# Patient Record
Sex: Female | Born: 1945 | Race: Black or African American | Hispanic: No | Smoking: Former smoker
Health system: Southern US, Community
[De-identification: ages and names within clinical notes are randomized; demographics above are authoritative.]

## PROBLEM LIST (undated history)

## (undated) DIAGNOSIS — K219 Gastro-esophageal reflux disease without esophagitis: Secondary | ICD-10-CM

## (undated) DIAGNOSIS — F32A Depression, unspecified: Secondary | ICD-10-CM

## (undated) DIAGNOSIS — I10 Essential (primary) hypertension: Secondary | ICD-10-CM

## (undated) DIAGNOSIS — K5790 Diverticulosis of intestine, part unspecified, without perforation or abscess without bleeding: Secondary | ICD-10-CM

## (undated) DIAGNOSIS — M1990 Unspecified osteoarthritis, unspecified site: Secondary | ICD-10-CM

## (undated) DIAGNOSIS — F419 Anxiety disorder, unspecified: Secondary | ICD-10-CM

## (undated) DIAGNOSIS — F329 Major depressive disorder, single episode, unspecified: Secondary | ICD-10-CM

## (undated) DIAGNOSIS — K449 Diaphragmatic hernia without obstruction or gangrene: Secondary | ICD-10-CM

## (undated) DIAGNOSIS — E669 Obesity, unspecified: Secondary | ICD-10-CM

## (undated) HISTORY — DX: Essential (primary) hypertension: I10

## (undated) HISTORY — DX: Unspecified osteoarthritis, unspecified site: M19.90

## (undated) HISTORY — DX: Gastro-esophageal reflux disease without esophagitis: K21.9

## (undated) HISTORY — DX: Diaphragmatic hernia without obstruction or gangrene: K44.9

## (undated) HISTORY — PX: APPENDECTOMY: SHX54

## (undated) HISTORY — DX: Obesity, unspecified: E66.9

## (undated) HISTORY — DX: Depression, unspecified: F32.A

## (undated) HISTORY — DX: Anxiety disorder, unspecified: F41.9

## (undated) HISTORY — DX: Major depressive disorder, single episode, unspecified: F32.9

## (undated) HISTORY — DX: Diverticulosis of intestine, part unspecified, without perforation or abscess without bleeding: K57.90

## (undated) HISTORY — PX: SPINAL FUSION: SHX223

---

## 1978-10-31 HISTORY — PX: TUBAL LIGATION: SHX77

## 1980-03-01 HISTORY — PX: VAGINAL HYSTERECTOMY: SUR661

## 1997-03-01 HISTORY — PX: HERNIA REPAIR: SHX51

## 1999-03-02 HISTORY — PX: KNEE SURGERY: SHX244

## 2000-06-29 HISTORY — PX: BUNIONECTOMY: SHX129

## 2002-12-11 ENCOUNTER — Ambulatory Visit (HOSPITAL_COMMUNITY): Admission: RE | Admit: 2002-12-11 | Discharge: 2002-12-11 | Payer: Self-pay | Admitting: Cardiology

## 2005-10-05 ENCOUNTER — Ambulatory Visit: Payer: Self-pay | Admitting: Cardiology

## 2006-08-02 ENCOUNTER — Ambulatory Visit: Payer: Self-pay | Admitting: Cardiology

## 2006-08-31 ENCOUNTER — Ambulatory Visit: Payer: Self-pay | Admitting: Cardiology

## 2006-09-06 ENCOUNTER — Ambulatory Visit: Payer: Self-pay | Admitting: Cardiology

## 2008-09-17 ENCOUNTER — Inpatient Hospital Stay (HOSPITAL_COMMUNITY): Admission: RE | Admit: 2008-09-17 | Discharge: 2008-09-19 | Payer: Self-pay | Admitting: Neurosurgery

## 2009-11-27 ENCOUNTER — Emergency Department (HOSPITAL_COMMUNITY): Admission: EM | Admit: 2009-11-27 | Discharge: 2009-11-27 | Payer: Self-pay | Admitting: Emergency Medicine

## 2009-11-27 ENCOUNTER — Inpatient Hospital Stay (HOSPITAL_COMMUNITY): Admission: AD | Admit: 2009-11-27 | Discharge: 2009-12-03 | Payer: Self-pay | Admitting: Psychiatry

## 2009-11-27 ENCOUNTER — Ambulatory Visit: Payer: Self-pay | Admitting: Psychiatry

## 2009-12-02 ENCOUNTER — Encounter: Admission: RE | Admit: 2009-12-02 | Discharge: 2009-12-02 | Payer: Self-pay | Admitting: Orthopedic Surgery

## 2009-12-08 HISTORY — PX: OTHER SURGICAL HISTORY: SHX169

## 2010-05-14 LAB — BASIC METABOLIC PANEL
BUN: 17 mg/dL (ref 6–23)
CO2: 28 mEq/L (ref 19–32)
Calcium: 9.1 mg/dL (ref 8.4–10.5)
Calcium: 8.3 mg/dL — ABNORMAL LOW (ref 8.4–10.5)
Creatinine, Ser: 0.69 mg/dL (ref 0.4–1.2)
GFR calc non Af Amer: >60 mL/min (ref >60)
Glucose, Bld: 112 mg/dL — ABNORMAL HIGH (ref 70–99)
Potassium: 4 mEq/L (ref 3.5–5.1)
Sodium: 131 mEq/L — ABNORMAL LOW (ref 135–145)

## 2010-05-14 LAB — DIFFERENTIAL
Basophils Absolute: 0 10*3/uL (ref 0.0–0.1)
Lymphocytes Relative: 37 % (ref 12–46)
Monocytes Absolute: 0.4 10*3/uL (ref 0.1–1.0)
Neutro Abs: 3 10*3/uL (ref 1.7–7.7)
Neutrophils Relative %: 54 % (ref 43–77)

## 2010-05-14 LAB — RAPID URINE DRUG SCREEN, HOSP PERFORMED
Amphetamines: NOT DETECTED
Opiates: NOT DETECTED
Tetrahydrocannabinol: NOT DETECTED

## 2010-05-14 LAB — URINALYSIS, ROUTINE W REFLEX MICROSCOPIC
Bilirubin Urine: NEGATIVE
Hgb urine dipstick: NEGATIVE
Protein, ur: NEGATIVE mg/dL
Urobilinogen, UA: 0.2 mg/dL (ref 0.0–1.0)

## 2010-05-14 LAB — CBC
HCT: 33.3 % — ABNORMAL LOW (ref 36.0–46.0)
RDW: 13.3 % (ref 11.5–15.5)
WBC: 5.5 10*3/uL (ref 4.0–10.5)

## 2010-06-07 LAB — CBC
HCT: 33.5 % — ABNORMAL LOW (ref 36.0–46.0)
Hemoglobin: 11.5 g/dL — ABNORMAL LOW (ref 12.0–15.0)
MCV: 98.7 fL (ref 78.0–100.0)
RBC: 3.39 MIL/uL — ABNORMAL LOW (ref 3.87–5.11)
WBC: 5.4 10*3/uL (ref 4.0–10.5)

## 2010-06-07 LAB — BASIC METABOLIC PANEL
BUN: 11 mg/dL (ref 6–23)
Chloride: 103 mEq/L (ref 96–112)
GFR calc Af Amer: >60 mL/min (ref >60)
GFR calc non Af Amer: >60 mL/min (ref >60)
Potassium: 4 mEq/L (ref 3.5–5.1)
Sodium: 137 mEq/L (ref 135–145)

## 2010-06-07 LAB — TYPE AND SCREEN

## 2010-06-07 LAB — ABO/RH: ABO/RH(D): A POS

## 2010-07-14 NOTE — Op Note (Signed)
NAMESHALI, [name]                ACCOUNT NO.:  [id number]   MEDICAL RECORD NO.:  [id number]          PATIENT TYPE:  INP   LOCATION:  3040                         FACILITY:  [facility]   PHYSICIAN:  Reinaldo Meeker, M.D. DATE OF BIRTH:  1945-12-06   DATE OF PROCEDURE:  09/17/2008  DATE OF DISCHARGE:                               OPERATIVE REPORT   PREOPERATIVE DIAGNOSIS:  Spondylolisthesis with foraminal stenosis, L5-  S1 bilateral.   POSTOPERATIVE DIAGNOSIS:  Spondylolisthesis with foraminal stenosis, L5-  S1 bilateral.   PROCEDURE:  Bilateral L5-S1 decompressive laminectomy, followed by  bilateral L5 microdiskectomy, followed by L5-S1 posterior lumbar  interbody fusion with PEEK interbody spacer and NuVasive bony spacer,  followed by nonsegmental instrumentation of L5-S1 with Biomet pedicle  screw system, followed by L5-S1 posterolateral fusion.   SECONDARY PROCEDURE:  Decompression of L5 and S1 nerve roots more so  than needed for posterior lumbar interbody fusion.   SURGEON:  Reinaldo Meeker, MD   ASSISTANT:  Kathaleen Maser. Pool, MD   PROCEDURE IN DETAIL:  After being placing in the prone position, the  patient's back was prepped and draped in the usual sterile fashion.  Localizing fluoroscopy was used prior to incision to identify the  appropriate level.  A midline incision was made above the spinous  process of L5 and S1.  Using Bovie cutting current, the incision was  carried down to spinous processes.  Subperiosteal dissection was then  carried out bilaterally on the spinous process and lamina facet joint  into the far lateral region to identify the transverse process of L5 and  far lateral region of the sacrum bilaterally.  A self-retaining  retractor was placed for exposure.  X-rays showed approach to the  appropriate level.  Spinous processes of L5 and S1 were removed.  Starting on the patient's left side, generous laminotomy was performed  by removing the entire L5  lamina, the medial three-quarters of facet  joint, and the superior one-third of the S1 lamina.  Residual bone was  removed and saved for use later in the case.  Ligamentum flavum was  removed in a piecemeal fashion.  L5 and S1 roots were decompressed more  so than needed for posterior lumbar interbody fusion.  Similar  decompression was then carried on the opposite side and was completed.  Residual midline structures were removed to complete bilateral  decompression.  At this time, L5 and S1 nerve roots were well visualized  bilaterally and decompressed thoroughly.  At this time, bilateral  microdiskectomy was carried out.  Particular attention was paid towards  the right where foraminal disk herniation was identified and removed to  decompress the L5 nerve root on that side.  Generous decompression and  disk space clean-out was carried out bilaterally with great care taken  avoid any injury to the neural elements, this was successfully done.  At  this time, pedicle screw fixation was carried out.  Drill hole entry  points were placed in standard fashion.  Pedicle awl was then passed  down through the pedicle into the vertebral body.  Tapping with  a 5.5-mm  tap was then carried out and 6.5 x 40 mm screws were placed bilaterally  at L5 and 6.5 x 35 mm screws were placed bilaterally at S1.  These were  confirmed to be in excellent position by AP and lateral fluoroscopy.  At  this time, high-speed drill was used to decorticate the far lateral  region.  A mixture of autologous bone and Osteocel Plus were placed for  posterolateral fusion.  Disk space was then prepared for posterior  lumbar interbody fusion.  On the first side, disk space was distracted  up to 10 mm size.  On the opposite side, rotating cutter was used  followed by placing of a 10 x 9 x 25-mm cage filled with Osteocel Plus  and autologous bone.  This was followed into good position under  fluoroscopy.  On the opposite side,  rotating cutter was used followed by  box chisel.  Prior to placing the second spacer, autologous bone and  Osteocel Plus were placed in the midline to help with the interbody  fusion.  NuVasive bony spacer was then placed and found to be in good  position under fluoroscopy.  Large amounts of irrigation were carried  out at this time.  Appropriate length rods were secured to the top of  the screws.  Compression was carried out of L5 down onto S1.  Gelfoam  was then placed over the dura and an epidural drain left in the epidural  space and brought out through a separate stab incision.  Final  fluoroscopy in AP and lateral direction showed good placement of screws  bilaterally as well as the spacers.  Wound was then closed in multiple  layers of Vicryl in the muscle fascia, subcutaneous, and subcuticular  tissues after large amounts of antibiotic irrigation was carried out  once more.  The skin was closed with staples.  Sterile dressings were  then applied and the patient was extubated and taken to recovery room in  stable condition.           ______________________________  Reinaldo Meeker, M.D.     ROK/MEDQ  D:  09/17/2008  T:  09/18/2008  Job:  [id number]

## 2010-07-17 NOTE — Cardiovascular Report (Signed)
NAMEJANICA, [name]                            ACCOUNT NO.:  [id number]   MEDICAL RECORD NO.:  [id number]                   PATIENT TYPE:  OIB   LOCATION:  2857                                 FACILITY:  [facility]   PHYSICIAN:  Arturo Morton. Riley Kill, M.D.             DATE OF BIRTH:  September 16, 1945   DATE OF PROCEDURE:  12/11/2002  DATE OF DISCHARGE:                              CARDIAC CATHETERIZATION   INDICATIONS:  [name] is a delightful 65 year old female who presents with  some recurrent chest pain.  She had a low risk Cardiolite and was treated  conservatively.  However, she continued to have symptoms and was  subsequently referred by Jonelle Sidle, M.D. [facility] for further evaluation  including cardiac catheterization.   PROCEDURE:  1. Left heart catheterization.  2. Selective coronary arteriography.  3. Selective left ventriculography.  4. Aortic root aortography.   DESCRIPTION OF PROCEDURE:  Prior to catheterization I met with the patient  and her fiance in the day catheterization area.  We reviewed the risks.  These had been previously reviewed by Jonelle Sidle, M.D. [facility] and  [name]. Julious Oka.  She was brought to the catheterization laboratory  and prepped and draped in the usual fashion.  Through an anterior puncture  the right femoral artery was easily entered.  A 6-French sheath was then  placed.  Views of the ventricle and proximal aortic root were then obtained.  Following this views of the right coronary artery and left coronary arteries  were obtained with standard Judkins catheters.  The patient tolerated the  procedure well and was taken to the holding area in satisfactory clinical  condition.  Intracoronary nitroglycerin was also administered.   HEMODYNAMIC DATA:  1. Central aorta 140/80.  2. Left ventricle 154/4/26.  3. No gradient on pullback across the aortic valve.   ANGIOGRAPHIC DATA:  1. Ventriculography was performed in the RAO projection.   Overall systolic     function appeared to be well preserved.  Because of ectopy ejection     fraction could not be reliably calculated.  However, no definite wall     motion abnormalities were seen.  2. The aortic root demonstrates no evidence of aortic insufficiency and     there is no clear cut evidence of aortic dissection.  3. The left main has a straight upward takeoff from the left coronary cusp.     The circumflex then goes in the superior direction.  4. The left anterior descending courses to the apex.  Other than minimal     luminal irregularity, no significant focal lesions are seen in either the     LAD or the circumflex.  5. The right coronary artery is a large, dominant vessel with a large loop     in the proximal mid junction.  No significant focal narrowing is noted.     There is perhaps some  mild plaquing at the origin of the posterior     descending with perhaps no more than 20% luminal narrowing which does not     appear to be flow limiting.  There is also marked tortuosity in the     posterolateral system which makes laying this out difficult but we took     multiple views and it appears to be free of significant disease.    CONCLUSIONS:  1. Well preserved left ventricular function.  2. No evidence of aortic dissection or aortic regurgitation.  3. No significant high grade focal coronary lesions.   The patient will get a D-dimer.  Follow-up in [place] will be arranged with  [name]. Julious Oka and also Jonelle Sidle, M.D. [facility].                                               Arturo Morton. Riley Kill, M.D.    TDS/MEDQ  D:  12/11/2002  T:  12/11/2002  Job:  [id number]   cc:   Jonelle Sidle, M.D. [facility]   CV Lab   [facility]

## 2010-07-17 NOTE — H&P (Signed)
[name], [name]                            ACCOUNT NO.:  [id number]   MEDICAL RECORD NO.:  [id number]                   PATIENT TYPE:  OIB   LOCATION:                                       FACILITY:  [facility]   PHYSICIAN:  Jonelle Sidle, M.D. [facility]        DATE OF BIRTH:  [birth year]   DATE OF ADMISSION:  12/11/2002  DATE OF DISCHARGE:                                HISTORY & PHYSICAL   HISTORY OF PRESENT ILLNESS:  A 65 year old, widowed, black female on chronic  disability because of anxiety, depression, and degenerative joint disease.  The patient presented to the emergency department at [facility] on  October 26, 2002, with complaints of weakness, chest discomfort, and left  upper quadrant abdominal pain.  She then saw [place] for cardiac evaluation on  November 20, 2002, and underwent stress nuclear study.  The patient  exercised to a maximum workload of 4.9 mets.  She had no chest pain or  arrhythmias.  Unfortunately this was a suboptimal image quality study due to  gut artifact.  There was an anterobasal and inferobasal defect.  The  inferior one appeared partially reversible, although gut artifact was likely  responsible for this.  The ejection fraction was calculated at 68% and the  sum score was 5.  Dr. Diona Browner felt this represented a low-risk study,  particularly taking into account her EKG findings.  We saw her back in the  office in followup on December 03, 2002, and unfortunately she is still  complaining of a vague left upper chest diffuse discomfort described as a  pressure, squeezing, and tightness.  At times it would radiate down her left  arm.  It can occur with exertion, but mostly occurs at rest.  Associated  with this is shortness of breath and extreme fatigue.  Because of this, she  would like a definite diagnosis and we are setting her up for elective heart  catheterization.  The patient agrees, understands, and accepts the risks and  benefits of the  explained procedure.   ALLERGIES:  1. EVISTA.  2. DILACOR.  3. NORVASC.   MEDICATIONS:  1. Effexor 75 mg two tablets q.a.m.  2. Premarin 0.6 mg daily.  3. Aciphex 20 mg daily.  4. Micardis/HCT 80/12.5 mg daily.  5. Seroquel 160 mg q.h.s.  6. Enteric-coated aspirin 81 mg daily.   REVIEW OF SYSTEMS:  No interim change since November 20, 2002.   PAST MEDICAL HISTORY:  Positive for:  1. Depression.  2. 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  3. History of hypertension, which has been longstanding.  4. Diverticulitis.  5. Hiatal hernia.  6. GERD.  7. Umbilical hernia surgically repaired.   SOCIAL HISTORY:  She is widowed.  Her husband died of lung cancer, despite  having been quit for 17 years.  She lives alone, but currently is newly  engaged.  She is the mother  of five grown children and has multiple  grandchildren.  She does not drink alcohol or smoke tobacco.  She has been  on disability for depression and DJD.   FAMILY HISTORY:  Her mother is 20.  Her father died of causes unknown.  She  had a brother who died at age 91 suddenly with an MI.   PHYSICAL EXAMINATION:  VITAL SIGNS:  The blood pressure is 110/78 and the  pulse is 78.  WEIGHT:  201 pounds.  GENERAL APPEARANCE:  A somewhat anxious black female in no acute distress.  HEENT:  PERRLA.  EOMI.  She is wearing braces.  NECK:  Without JVD, carotid bruits, thyromegaly, or adenopathy.  LUNGS:  Clear to A&P with good bilateral chest expansion.  HEART:  Rhythm is regular.  Normal S1 and S2 without any murmurs, rubs, or  clicks.  CHEST:  She has no anterior chest wall tenderness.  ABDOMEN:  Soft and obese.  Bowel sounds are present.  No tenderness or  bruits are noted.  EXTREMITIES:  Free of edema.  Pedal pulses are intact.   LABORATORY DATA:  The EKG has shown normal sinus rhythm and sinus  bradycardia with nonspecific ST-T wave changes.   IMPRESSION:  1. Atypical chest discomfort with a suboptimal nuclear  study on November 23, 2002, because of gut artifact.  She will be admitted now for elective     heart catheterization to define her coronary anatomy.  2. History of gastrointestinal reflux disease with history of hiatal hernia.  3. Hypertension.  4. Depression/anxiety.  5. Obesity.  6. Degenerative joint disease to low back and knees.   PLAN:  As outlined.  The patient agrees, understands, and accepts the risks  and benefits of the explained procedure.      Suszanne Conners. Julious Oka.                    Jonelle Sidle, M.D. [facility]    MRD/MEDQ  D:  12/03/2002  T:  12/03/2002  Job:  [id number]   cc:   Donzetta Sprung  [address], [address]  [place]  [place] [address]  Fax: [phone]

## 2010-08-27 ENCOUNTER — Encounter: Payer: Self-pay | Admitting: Cardiology

## 2010-08-27 DIAGNOSIS — R072 Precordial pain: Secondary | ICD-10-CM

## 2011-09-13 ENCOUNTER — Encounter: Payer: Self-pay | Admitting: Cardiology

## 2011-09-13 DIAGNOSIS — R079 Chest pain, unspecified: Secondary | ICD-10-CM

## 2012-04-12 IMAGING — CT CT EXTREM UP W/O CM*R*
3 series · 8 of 14 positions shown, 9 images · non-contrast
Comparison: None.

CLINICAL DATA: Status post rotator cuff repair and distal clavicle
resection.  Evaluate for possible fracture.

CT RIGHT SHOULDER WITHOUT CONTRAST
TECHNIQUE: Multidetector CT imaging of the right shoulder was
performed according to the standard protocol without intravenous
contrast. Multiplanar CT image reconstructions were also generated.

[Series 3: shoulder/standard · axial · 0.50mm/px · z∈[+10,+125]mm · 3 of 93 slices shown]
[im 24/93  soft-tissue]
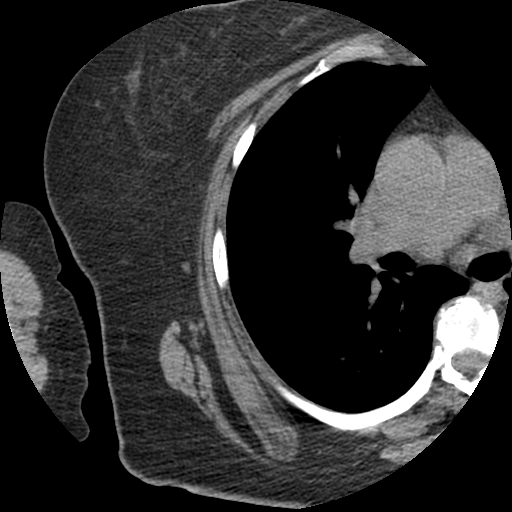
[im 47/93  soft-tissue]
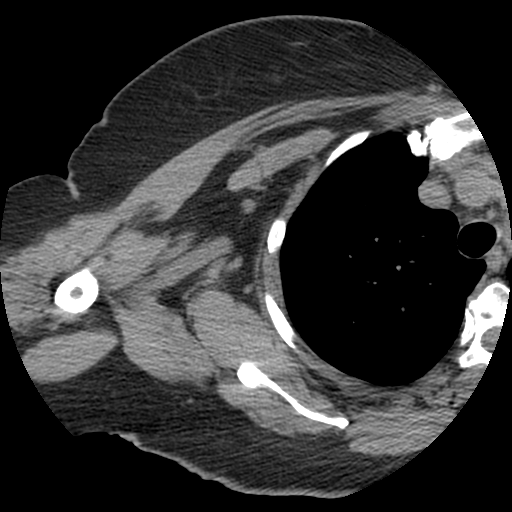
[im 70/93  soft-tissue]
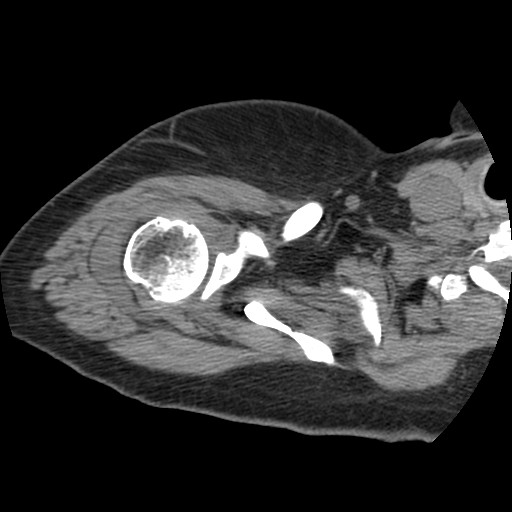

[Series 400: coronal · coronal · 0.50mm/px · 2 of 80 slices shown]
[im 27/80  bone]
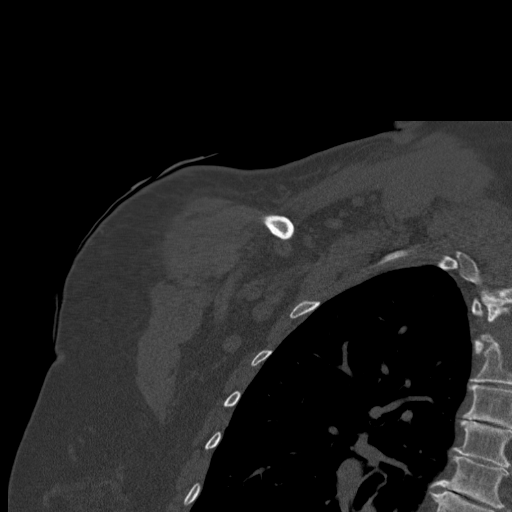
[im 53/80  bone]
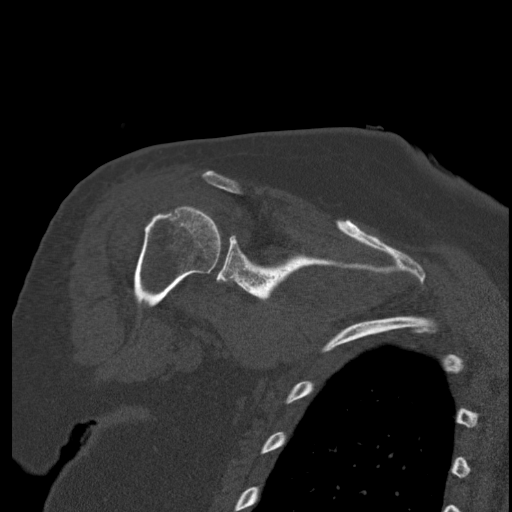

[Series 401: sagittals · sagittal · 0.50mm/px · 3 of 97 slices shown, 4 images]
[im 25/97  soft-tissue]
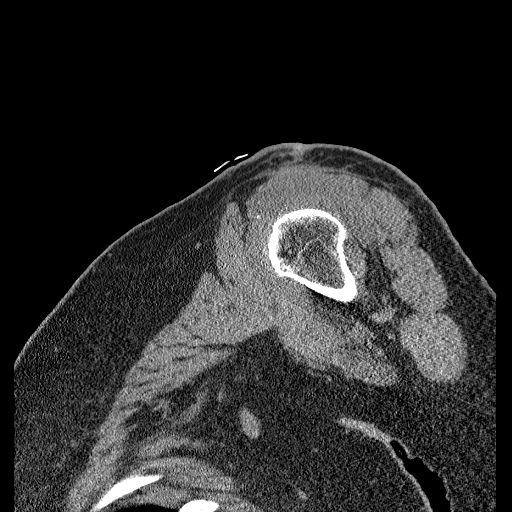
[im 25/97  bone]
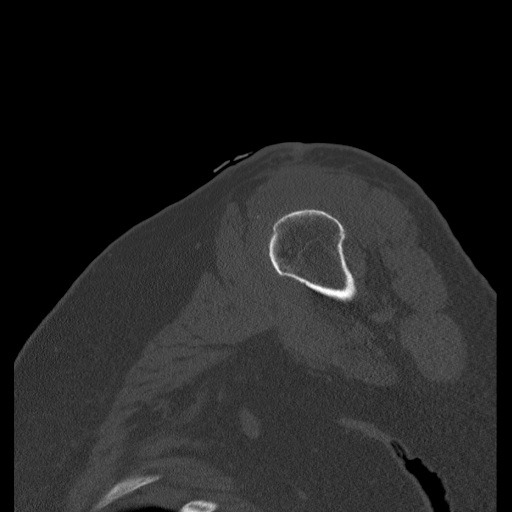
[im 49/97  bone]
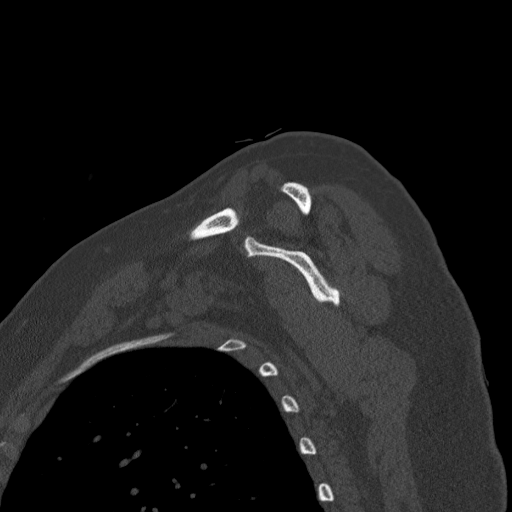
[im 73/97  bone]
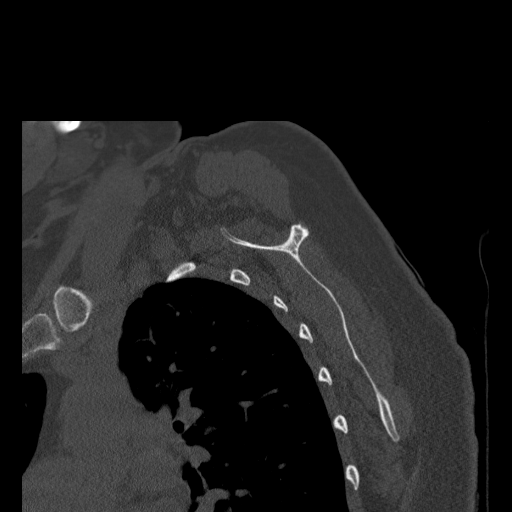

[8 of 14 positions shown; findings below may reference images not displayed]

FINDINGS: There is a large, 19 mm, fragment of bone next to the
distal clavicle which could be a fracture from the acromion or a
piece of the resected distal clavicle.  A small bone fragment is
noted slightly more laterally.

The humeral head and neck appear normal.  No glenoid fractures are
seen.  There is a large subacromial/subdeltoid fluid collection
noted.
IMPRESSION: 1.  Large bone fragments near the distal clavicle may reflect an
acromial avulsion fracture or a piece of the resected distal
clavicle.
2.  Large subacromial/subdeltoid fluid collection.

## 2013-03-20 ENCOUNTER — Encounter: Payer: Self-pay | Admitting: Cardiology

## 2013-04-19 ENCOUNTER — Encounter: Payer: Self-pay | Admitting: *Deleted

## 2013-04-23 ENCOUNTER — Encounter: Payer: Self-pay | Admitting: Cardiology

## 2013-04-23 ENCOUNTER — Ambulatory Visit (INDEPENDENT_AMBULATORY_CARE_PROVIDER_SITE_OTHER): Payer: Medicare FFS | Admitting: Cardiology

## 2013-04-23 VITALS — BP 138/78 | HR 95 | Ht 65.0 in | Wt 248.0 lb

## 2013-04-23 DIAGNOSIS — I059 Rheumatic mitral valve disease, unspecified: Secondary | ICD-10-CM

## 2013-04-23 DIAGNOSIS — G4734 Idiopathic sleep related nonobstructive alveolar hypoventilation: Secondary | ICD-10-CM

## 2013-04-23 DIAGNOSIS — I071 Rheumatic tricuspid insufficiency: Secondary | ICD-10-CM

## 2013-04-23 DIAGNOSIS — R079 Chest pain, unspecified: Secondary | ICD-10-CM

## 2013-04-23 DIAGNOSIS — I10 Essential (primary) hypertension: Secondary | ICD-10-CM | POA: Insufficient documentation

## 2013-04-23 DIAGNOSIS — I340 Nonrheumatic mitral (valve) insufficiency: Secondary | ICD-10-CM | POA: Insufficient documentation

## 2013-04-23 DIAGNOSIS — I079 Rheumatic tricuspid valve disease, unspecified: Secondary | ICD-10-CM

## 2013-04-23 DIAGNOSIS — Z01810 Encounter for preprocedural cardiovascular examination: Secondary | ICD-10-CM

## 2013-04-23 DIAGNOSIS — G4733 Obstructive sleep apnea (adult) (pediatric): Secondary | ICD-10-CM

## 2013-04-23 NOTE — Patient Instructions (Signed)
Your physician recommends that you schedule a follow-up appointment in: 6 months with Dr. Wyline MoodBranch. You should receive a letter in the mail in 4 months. If you do not receive this letter by June 2015 call our office to schedule this appointment.   Your physician recommends that you continue on your current medications as directed. Please refer to the Current Medication list given to you today.  Your physician has requested that you have a dobutamine myoview. For furth information please visit [email or web address]. Please follow instruction sheet, as given.

## 2013-04-23 NOTE — Progress Notes (Addendum)
Clinical Summary [name] is a 68 y.o.female seen today as a new patient.   1. Preoperative cardiac evaluation - history of abdominal pain related to umbilical hernia, being considered for surgery. - no known history of heart trourbles.   does have some chest pain at times. Started approx 1 year. Aching/sharp pain left chest, 6/10. +nausea, left arm and hand numbness. + SOB + diaphoresis. Can occur at rest or with exertion. Pain lasts 5 minutes. Occurs approx 1-2 times a week. Notes increase in frequency, increasing in severity. Increased fatigue/DOE.  - echo 10/2012:technically difficult study, LVEF 60-65%, mild to mod MR, mild to mod TR, diastolic dysfunction not defined grade.  - fairly sedentary lifestyle due to chronic lung issues requiring 24 hr O2, highest level of exertion is walking around at home.   CAD risk factors: HTN. Brother MI 6350s, daughter died of cardiac causes 67 yo.  2. OSA - on CPAP at night, O2 during day/night.    Past Medical History  Diagnosis Date  . Unspecified essential hypertension   . GERD (gastroesophageal reflux disease)   . DJD (degenerative joint disease)   . Depression   . Anxiety   . Diverticulosis   . Obesity   . Hiatal hernia      Allergies  Allergen Reactions  . Amlodipine   . Calcium Channel Blockers   . Diltiazem      Current Outpatient Prescriptions  Medication Sig Dispense Refill  . ALPRAZolam (XANAX XR) 0.5 MG 24 hr tablet Take 0.5 mg by mouth 2 (two) times daily as needed for anxiety.      . DULoxetine (CYMBALTA) 60 MG capsule Take 60 mg by mouth 2 (two) times daily.      . fluticasone (FLONASE) 50 MCG/ACT nasal spray Place 2 sprays into both nostrils daily.      . Multiple Vitamin (MULTIVITAMIN) tablet Take 1 tablet by mouth daily.      Marland Kitchen. omeprazole (PRILOSEC) 20 MG capsule Take 20 mg by mouth daily.      . QUEtiapine (SEROQUEL) 200 MG tablet Take 200 mg by mouth at bedtime.      . valsartan (DIOVAN) 160 MG tablet Take  160 mg by mouth daily.       No current facility-administered medications for this visit.     Past Surgical History  Procedure Laterality Date  . Appendectomy    . Hernia repair  1999  . Spinal fusion    . Knee surgery  2001  . Right shoulder surgery  12/08/2009  . Tubal ligation  1980'S  . Vaginal hysterectomy  1982  . Bunionectomy  06/2000    LEFT FOOT     Allergies  Allergen Reactions  . Amlodipine   . Calcium Channel Blockers   . Diltiazem       Family History  Problem Relation Age of Onset  . Diabetes    . Hypertension    . CAD    . Heart attack    . Breast cancer       Social History Ms. Zeringue reports that she quit smoking about 31 years ago. Her smoking use included Cigarettes. She has a 2 pack-year smoking history. She does not have any smokeless tobacco history on file. Ms. Steele Bergenn has no alcohol history on file.   Review of Systems CONSTITUTIONAL: No weight loss, fever, chills, weakness or fatigue.  HEENT: Eyes: No visual loss, blurred vision, double vision or yellow sclerae.No hearing loss, sneezing, congestion, runny  nose or sore throat.  SKIN: No rash or itching.  CARDIOVASCULAR: per HPI RESPIRATORY: chronic SOB  GASTROINTESTINAL: No anorexia, nausea, vomiting or diarrhea. No abdominal pain or blood.  GENITOURINARY: No burning on urination, no polyuria NEUROLOGICAL: No headache, dizziness, syncope, paralysis, ataxia, numbness or tingling in the extremities. No change in bowel or bladder control.  MUSCULOSKELETAL: No muscle, back pain, joint pain or stiffness.  LYMPHATICS: No enlarged nodes. No history of splenectomy.  PSYCHIATRIC: No history of depression or anxiety.  ENDOCRINOLOGIC: No reports of sweating, cold or heat intolerance. No polyuria or polydipsia.  Marland Kitchen.   Physical Examination p 95 bp 138/78 Wt 248 lbs BMI 41 Gen: resting comfortably, no acute distress HEENT: no scleral icterus, pupils equal round and reactive, no palptable cervical  adenopathy,  CV: RRR, no m/r/g, no JVD, no carotid bruits Resp: Clear to auscultation bilaterally GI: abdomen is soft, non-tender, non-distended, normal bowel sounds, no hepatosplenomegaly MSK: extremities are warm, no edema.  Skin: warm, no rash Neuro:  no focal deficits Psych: appropriate affect   Diagnostic Studies echo 10/2012:technically difficult study, LVEF 60-65%, mild to mod MR, mild to mod TR, diastolic dysfunction not defined grade.     Assessment and Plan  1. Chest pain - potential cardiac etiology, will obtain stress test - due to her chronic lung disease, inability to exercise, and poor acoustic echo windows will obtain a dobutamine nuclear stress test.  2. Preoperative cardiac evaluation - in setting of intermittent chest pain will follow up results of stress test prior to making recommendations for surgery - unable to assess her cardiac functional status by history, limited due to chronic lung disease.  3. Valvular heart disease - mild to mod MR and TR by recent echo, will follow clinically.  4. HTN - managed by PCP, bp at goal in clinic today   She reports seeing [facility] cardiology recently, will request those records.   Antoine PocheJonathan F. Ferris Fielden, M.D., F.A.C.C.   05/02/13 Addendum Further records obtainted since visit.  Exercise nuclear stress test 07/2010, THR achieved, no ischemic EKG changes, LVEF 77%. Small fixed basal inferior defect most likely attenuation artifact - 08/2011 DSE: did not reach THR (79% if THR), suboptimal test,  no ischemic EKG changes, no ischemia by echo - 04/2013 DSE: hypotensive bp response, test terminated early, did not reach target heart rate.   Overall, non-invasive testing has not been definitive in ruling out ischemia as described above and she has had continuing progressing symptoms. Given her advanced lung disease, options for non-invasive testing have been limited (cannot exercise, high risk for regadenoson) and she had a severe  hypotensive response to dobutamine. She has several CAD risk factors including age, HTN, and strong family history of early obstructive CAD. In this setting recommend a definitive catheterization. This will also allow us to accurately risk stratify her for an elective hernia surgery that she is considering. She is worried as she had a daughter who passed away after a hysterectomy that she reports was related to undiagnosed heart troubles. Will refer for [facility].  Dina RichJonathan Denzell Colasanti MD   05/07/13 Addendum Left heart cath with patent coronaries, did show some elevated LVEDP consistent with diastolic dysfunction. Will clear for surgery, start on lasix 20mg  daily in setting of elevated left sided pressures.  Dina RichJonathan Nazarene Bunning MD

## 2013-05-01 ENCOUNTER — Encounter (HOSPITAL_COMMUNITY)
Admission: RE | Admit: 2013-05-01 | Discharge: 2013-05-01 | Disposition: A | Discharge status: Home / Self Care | Payer: Medicare FFS | Source: Ambulatory Visit | Attending: Cardiology | Admitting: Cardiology

## 2013-05-01 ENCOUNTER — Encounter (HOSPITAL_COMMUNITY): Payer: Self-pay

## 2013-05-01 DIAGNOSIS — R079 Chest pain, unspecified: Secondary | ICD-10-CM

## 2013-05-01 MED ORDER — DOBUTAMINE IN D5W 4-5 MG/ML-% IV SOLN
INTRAVENOUS | Status: AC
  Filled 2013-05-01: qty 250

## 2013-05-01 MED ORDER — ATROPINE SULFATE 1 MG/ML IJ SOLN
INTRAMUSCULAR | Status: AC
  Administered 2013-05-01: 0.5 mg via INTRAVENOUS
  Filled 2013-05-01: qty 1

## 2013-05-01 MED ORDER — TECHNETIUM TC 99M SESTAMIBI GENERIC - CARDIOLITE: 30.0000 | Freq: Once | INTRAVENOUS | Status: DC | PRN

## 2013-05-01 MED ORDER — TECHNETIUM TC 99M SESTAMIBI - CARDIOLITE
10.0000 | Freq: Once | INTRAVENOUS | Status: AC | PRN
  Administered 2013-05-01: 07:00:00 10 via INTRAVENOUS

## 2013-05-01 NOTE — Progress Notes (Signed)
Stress Lab Nurses Notes - Diana Evesnnie Traynham  Kaho Heesch 05/01/2013 Reason for doing test: Chest Pain and Surgical Clearance Type of test: Dobutamine Cardiolite Nurse performing test: Parke PoissonPhyllis Billingsly, RN Nuclear Medicine Tech: Lyndel Pleasureyan Liles Echo Tech: Not Applicable MD performing test: S. McDowell/K.Lawrence NP Family MD: Dr. Reuel Boomaniel Test explained and consent signed: yes IV started: 22g jelco, Saline lock flushed, No redness or edema and Saline lock started in radiology Symptoms: Dizziness , chest tightness & Nausea Treatment/Intervention: Atropine 0.5 mg IV and 500cc of NS given IV  Reason test stopped: dizziness and BP dropped, unable to tolerated, Dobutaime stopped After recovery IV was: Discontinued via X-ray tech and No redness or edema Patient to return to Nuc. Med at : [facility] due to BP dropping Patient discharged: Home Patient's Condition upon discharge was: stable Comments:During test peak BP 162/120 & HR 120. Dobutamine titrated every 3 min. Unable to reach THR,  BP dropped down 51/33 & HR 85.Dobutamine stopped.  Recovery BP 123/59 & HR 86.  Symptoms resolved in recovery. Erskine SpeedBillingsley, Derrico Zhong T

## 2013-05-02 ENCOUNTER — Telehealth: Payer: Self-pay | Admitting: Cardiology

## 2013-05-02 ENCOUNTER — Encounter (HOSPITAL_COMMUNITY): Payer: Self-pay | Admitting: Pharmacy Technician

## 2013-05-02 ENCOUNTER — Encounter: Payer: Self-pay | Admitting: Cardiology

## 2013-05-02 MED FILL — Dobutamine in Dextrose 5% Inj 4 MG/ML: INTRAVENOUS | Qty: 250 | Status: AC

## 2013-05-02 NOTE — Telephone Encounter (Signed)
Dr. Cristy FolksBeacham called wanting to know if [name] is cleared for Hernia surgery. Please call [phone] and [facility].

## 2013-05-02 NOTE — Telephone Encounter (Signed)
Message copied by Burnice LoganATES, Garrie Woodin M on Wed May 02, 2013  1:38 PM ------      Message from: AlhambraBRANCH, JONATHAN F      Created: Wed May 02, 2013 12:50 PM       Patient needs to be arranged for left heart cath with coronary angiography for chest chest pain. Any cardiologist is fine.             Dina RichJonathan Branch MD ------

## 2013-05-02 NOTE — Telephone Encounter (Signed)
Pt informed of below. Pt agreed to have Cath done ASAP. Cath scheduled for Friday 05-04-13 at 10:30 pt to arrive at 8:30. Pt coming to office to go over instructions today before 5.

## 2013-05-02 NOTE — Telephone Encounter (Signed)
Spoke with [name] and informed her of MD request for heart cath.  See below for details.   Patient is schduled for Friday 05/04/13 @ 10:30 with Dr. [place]. Pt to arrive at 8:30 AM. Does pt need to hold any medications.  ===View-only below this line===  ----- Message -----    From: Antoine PocheJonathan F Branch, MD    Sent: 05/02/2013  12:50 PM      To: Burnice LoganBarbara M Cates, CMA  Patient needs to be arranged for left heart cath with coronary angiography for chest chest pain. Any cardiologist is fine.   Dina RichJonathan Branch MD

## 2013-05-02 NOTE — Telephone Encounter (Signed)
Left Heart Cath Friday 05/04/13 @ 10:30 with Dr. Sw[place]Jordan.  Checking percert

## 2013-05-04 ENCOUNTER — Ambulatory Visit (HOSPITAL_COMMUNITY)
Admission: RE | Admit: 2013-05-04 | Discharge: 2013-05-04 | Disposition: A | Discharge status: Home / Self Care | Payer: Medicare FFS | Source: Ambulatory Visit | Attending: Cardiology | Admitting: Cardiology

## 2013-05-04 ENCOUNTER — Encounter (HOSPITAL_COMMUNITY)
Admission: RE | Disposition: A | Discharge status: Home / Self Care | Payer: Self-pay | Source: Ambulatory Visit | Attending: Cardiology

## 2013-05-04 DIAGNOSIS — J984 Other disorders of lung: Secondary | ICD-10-CM | POA: Insufficient documentation

## 2013-05-04 DIAGNOSIS — F3289 Other specified depressive episodes: Secondary | ICD-10-CM | POA: Insufficient documentation

## 2013-05-04 DIAGNOSIS — M1990 Unspecified osteoarthritis, unspecified site: Secondary | ICD-10-CM | POA: Insufficient documentation

## 2013-05-04 DIAGNOSIS — R079 Chest pain, unspecified: Secondary | ICD-10-CM | POA: Insufficient documentation

## 2013-05-04 DIAGNOSIS — Z87891 Personal history of nicotine dependence: Secondary | ICD-10-CM | POA: Insufficient documentation

## 2013-05-04 DIAGNOSIS — E669 Obesity, unspecified: Secondary | ICD-10-CM | POA: Insufficient documentation

## 2013-05-04 DIAGNOSIS — F411 Generalized anxiety disorder: Secondary | ICD-10-CM | POA: Insufficient documentation

## 2013-05-04 DIAGNOSIS — I10 Essential (primary) hypertension: Secondary | ICD-10-CM | POA: Insufficient documentation

## 2013-05-04 DIAGNOSIS — F329 Major depressive disorder, single episode, unspecified: Secondary | ICD-10-CM | POA: Insufficient documentation

## 2013-05-04 DIAGNOSIS — K219 Gastro-esophageal reflux disease without esophagitis: Secondary | ICD-10-CM | POA: Insufficient documentation

## 2013-05-04 DIAGNOSIS — K5730 Diverticulosis of large intestine without perforation or abscess without bleeding: Secondary | ICD-10-CM | POA: Insufficient documentation

## 2013-05-04 DIAGNOSIS — K449 Diaphragmatic hernia without obstruction or gangrene: Secondary | ICD-10-CM | POA: Insufficient documentation

## 2013-05-04 DIAGNOSIS — I38 Endocarditis, valve unspecified: Secondary | ICD-10-CM | POA: Insufficient documentation

## 2013-05-04 HISTORY — PX: LEFT HEART CATHETERIZATION WITH CORONARY ANGIOGRAM: SHX5451

## 2013-05-04 LAB — CBC
HCT: 31.1 % — ABNORMAL LOW (ref 36.0–46.0)
HEMOGLOBIN: 10.4 g/dL — AB (ref 12.0–15.0)
MCH: 31.9 pg (ref 26.0–34.0)
MCHC: 33.4 g/dL (ref 30.0–36.0)
MCV: 95.4 fL (ref 78.0–100.0)
PLATELETS: 229 10*3/uL (ref 150–400)
RBC: 3.26 MIL/uL — AB (ref 3.87–5.11)
RDW: 13.9 % (ref 11.5–15.5)
WBC: 5.5 10*3/uL (ref 4.0–10.5)

## 2013-05-04 LAB — BASIC METABOLIC PANEL
BUN: 14 mg/dL (ref 6–23)
CHLORIDE: 101 meq/L (ref 96–112)
CO2: 24 meq/L (ref 19–32)
Calcium: 9.3 mg/dL (ref 8.4–10.5)
Creatinine, Ser: 0.85 mg/dL (ref 0.50–1.10)
GFR calc Af Amer: 81 mL/min — ABNORMAL LOW (ref >90)
GFR calc non Af Amer: 70 mL/min — ABNORMAL LOW (ref >90)
Glucose, Bld: 122 mg/dL — ABNORMAL HIGH (ref 70–99)
POTASSIUM: 4 meq/L (ref 3.7–5.3)
SODIUM: 139 meq/L (ref 137–147)

## 2013-05-04 LAB — PROTIME-INR
INR: 0.9 (ref 0.00–1.49)
PROTHROMBIN TIME: 12 s (ref 11.6–15.2)

## 2013-05-04 SURGERY — LEFT HEART CATHETERIZATION WITH CORONARY ANGIOGRAM
Anesthesia: LOCAL

## 2013-05-04 MED ORDER — HEPARIN SODIUM (PORCINE) 1000 UNIT/ML IJ SOLN
INTRAMUSCULAR | Status: AC
  Filled 2013-05-04: qty 1

## 2013-05-04 MED ORDER — ASPIRIN 81 MG PO CHEW
81.0000 mg | CHEWABLE_TABLET | ORAL | Status: AC
  Administered 2013-05-04: 81 mg via ORAL
  Filled 2013-05-04: qty 1

## 2013-05-04 MED ORDER — MIDAZOLAM HCL 2 MG/2ML IJ SOLN
INTRAMUSCULAR | Status: AC
  Filled 2013-05-04: qty 2

## 2013-05-04 MED ORDER — VERAPAMIL HCL 2.5 MG/ML IV SOLN
INTRAVENOUS | Status: AC
  Filled 2013-05-04: qty 2

## 2013-05-04 MED ORDER — SODIUM CHLORIDE 0.9 % IV SOLN
INTRAVENOUS | Status: DC
  Administered 2013-05-04: 09:00:00 via INTRAVENOUS

## 2013-05-04 MED ORDER — HEPARIN (PORCINE) IN NACL 2-0.9 UNIT/ML-% IJ SOLN
INTRAMUSCULAR | Status: AC
  Filled 2013-05-04: qty 1500

## 2013-05-04 MED ORDER — LIDOCAINE HCL (PF) 1 % IJ SOLN
INTRAMUSCULAR | Status: AC
Start: 2013-05-04 — End: 2013-05-04
  Filled 2013-05-04: qty 30

## 2013-05-04 MED ORDER — FENTANYL CITRATE 0.05 MG/ML IJ SOLN
INTRAMUSCULAR | Status: AC
  Filled 2013-05-04: qty 2

## 2013-05-04 MED ORDER — SODIUM CHLORIDE 0.9 % IV SOLN: 250.0000 mL | INTRAVENOUS | Status: DC | PRN

## 2013-05-04 MED ORDER — NITROGLYCERIN 0.2 MG/ML ON CALL CATH LAB
INTRAVENOUS | Status: AC
  Filled 2013-05-04: qty 1

## 2013-05-04 MED ORDER — SODIUM CHLORIDE 0.9 % IJ SOLN: 3.0000 mL | INTRAMUSCULAR | Status: DC | PRN

## 2013-05-04 MED ORDER — SODIUM CHLORIDE 0.9 % IJ SOLN: 3.0000 mL | Freq: Two times a day (BID) | INTRAMUSCULAR | Status: DC

## 2013-05-04 MED ORDER — SODIUM CHLORIDE 0.9 % IV SOLN: 1.0000 mL/kg/h | INTRAVENOUS | Status: DC

## 2013-05-04 NOTE — H&P (View-Only) (Signed)
Clinical Summary [name] is a 68 y.o.female seen today as a new patient.   1. Preoperative cardiac evaluation - history of abdominal pain related to umbilical hernia, being considered for surgery. - no known history of heart trourbles.   does have some chest pain at times. Started approx 1 year. Aching/sharp pain left chest, 6/10. +nausea, left arm and hand numbness. + SOB + diaphoresis. Can occur at rest or with exertion. Pain lasts 5 minutes. Occurs approx 1-2 times a week. Notes increase in frequency, increasing in severity. Increased fatigue/DOE.  - echo 10/2012:technically difficult study, LVEF 60-65%, mild to mod MR, mild to mod TR, diastolic dysfunction not defined grade.  - fairly sedentary lifestyle due to chronic lung issues requiring 24 hr O2, highest level of exertion is walking around at home.   CAD risk factors: HTN. Brother MI 6350s, daughter died of cardiac causes 67 yo.  2. OSA - on CPAP at night, O2 during day/night.    Past Medical History  Diagnosis Date  . Unspecified essential hypertension   . GERD (gastroesophageal reflux disease)   . DJD (degenerative joint disease)   . Depression   . Anxiety   . Diverticulosis   . Obesity   . Hiatal hernia      Allergies  Allergen Reactions  . Amlodipine   . Calcium Channel Blockers   . Diltiazem      Current Outpatient Prescriptions  Medication Sig Dispense Refill  . ALPRAZolam (XANAX XR) 0.5 MG 24 hr tablet Take 0.5 mg by mouth 2 (two) times daily as needed for anxiety.      . DULoxetine (CYMBALTA) 60 MG capsule Take 60 mg by mouth 2 (two) times daily.      . fluticasone (FLONASE) 50 MCG/ACT nasal spray Place 2 sprays into both nostrils daily.      . Multiple Vitamin (MULTIVITAMIN) tablet Take 1 tablet by mouth daily.      Marland Kitchen. omeprazole (PRILOSEC) 20 MG capsule Take 20 mg by mouth daily.      . QUEtiapine (SEROQUEL) 200 MG tablet Take 200 mg by mouth at bedtime.      . valsartan (DIOVAN) 160 MG tablet Take  160 mg by mouth daily.       No current facility-administered medications for this visit.     Past Surgical History  Procedure Laterality Date  . Appendectomy    . Hernia repair  1999  . Spinal fusion    . Knee surgery  2001  . Right shoulder surgery  12/08/2009  . Tubal ligation  1980'S  . Vaginal hysterectomy  1982  . Bunionectomy  06/2000    LEFT FOOT     Allergies  Allergen Reactions  . Amlodipine   . Calcium Channel Blockers   . Diltiazem       Family History  Problem Relation Age of Onset  . Diabetes    . Hypertension    . CAD    . Heart attack    . Breast cancer       Social History Ms. Zeringue reports that she quit smoking about 31 years ago. Her smoking use included Cigarettes. She has a 2 pack-year smoking history. She does not have any smokeless tobacco history on file. Ms. Steele Bergenn has no alcohol history on file.   Review of Systems CONSTITUTIONAL: No weight loss, fever, chills, weakness or fatigue.  HEENT: Eyes: No visual loss, blurred vision, double vision or yellow sclerae.No hearing loss, sneezing, congestion, runny  nose or sore throat.  SKIN: No rash or itching.  CARDIOVASCULAR: per HPI RESPIRATORY: chronic SOB  GASTROINTESTINAL: No anorexia, nausea, vomiting or diarrhea. No abdominal pain or blood.  GENITOURINARY: No burning on urination, no polyuria NEUROLOGICAL: No headache, dizziness, syncope, paralysis, ataxia, numbness or tingling in the extremities. No change in bowel or bladder control.  MUSCULOSKELETAL: No muscle, back pain, joint pain or stiffness.  LYMPHATICS: No enlarged nodes. No history of splenectomy.  PSYCHIATRIC: No history of depression or anxiety.  ENDOCRINOLOGIC: No reports of sweating, cold or heat intolerance. No polyuria or polydipsia.  Marland Kitchen   Physical Examination p 95 bp 138/78 Wt 248 lbs BMI 41 Gen: resting comfortably, no acute distress HEENT: no scleral icterus, pupils equal round and reactive, no palptable cervical  adenopathy,  CV: RRR, no m/r/g, no JVD, no carotid bruits Resp: Clear to auscultation bilaterally GI: abdomen is soft, non-tender, non-distended, normal bowel sounds, no hepatosplenomegaly MSK: extremities are warm, no edema.  Skin: warm, no rash Neuro:  no focal deficits Psych: appropriate affect   Diagnostic Studies echo 10/2012:technically difficult study, LVEF 60-65%, mild to mod MR, mild to mod TR, diastolic dysfunction not defined grade.     Assessment and Plan  1. Chest pain - potential cardiac etiology, will obtain stress test - due to her chronic lung disease, inability to exercise, and poor acoustic echo windows will obtain a dobutamine nuclear stress test.  2. Preoperative cardiac evaluation - in setting of intermittent chest pain will follow up results of stress test prior to making recommendations for surgery - unable to assess her cardiac functional status by history, limited due to chronic lung disease.  3. Valvular heart disease - mild to mod MR and TR by recent echo, will follow clinically.  4. HTN - managed by PCP, bp at goal in clinic today   She reports seeing [facility] cardiology recently, will request those records.   Antoine Poche, M.D., F.A.C.C.   05/02/13 Addendum Further records obtainted since visit.  Exercise nuclear stress test 07/2010, THR achieved, no ischemic EKG changes, LVEF 77%. Small fixed basal inferior defect most likely attenuation artifact - 08/2011 DSE: did not reach THR (79% if THR), suboptimal test,  no ischemic EKG changes, no ischemia by echo - 04/2013 DSE: hypotensive bp response, test terminated early, did not reach target heart rate.   Overall, non-invasive testing has not been definitive in ruling out ischemia as described above and she has had continuing progressing symptoms. Given her advanced lung disease, options for non-invasive testing have been limited (cannot exercise, high risk for regadenoson) and she had a severe  hypotensive response to dobutamine. She has several CAD risk factors including age, HTN, and strong family history of early obstructive CAD. In this setting recommend a definitive catheterization. This will also allow [place] to accurately risk stratify her for an elective hernia surgery that she is considering. She is worried as she had a daughter who passed away after a hysterectomy that she reports was related to undiagnosed heart troubles. Will refer for LHC.  Dina Rich MD

## 2013-05-04 NOTE — Interval H&P Note (Signed)
History and Physical Interval Note:  05/04/2013 10:46 AM  [name]  has presented today for surgery, with the diagnosis of chest pain  The various methods of treatment have been discussed with the patient and family. After consideration of risks, benefits and other options for treatment, the patient has consented to  Procedure(s): LEFT HEART CATHETERIZATION WITH CORONARY ANGIOGRAM (N/A) as a surgical intervention .  The patient's history has been reviewed, patient examined, no change in status, stable for surgery.  I have reviewed the patient's chart and labs.  Questions were answered to the patient's satisfaction.   Cath Lab Visit (complete for each Cath Lab visit)  Clinical Evaluation Leading to the Procedure:   ACS: no  Non-ACS:    Anginal Classification: CCS III  Anti-ischemic medical therapy: No Therapy  Non-Invasive Test Results: Equivocal test results  Prior CABG: No previous CABG        Theron Aristaeter [facility] CenterJordanMD,FACC 05/04/2013 10:46 AM

## 2013-05-04 NOTE — CV Procedure (Signed)
    Cardiac Catheterization Procedure Note  Name: Michelle DivineShirley M Mester MRN: [id number] DOB: 07-22-1945  Procedure: Left Heart Cath, Selective Coronary Angiography, LV angiography  Indication: 68 yo BF with history of HTN presents with refractory chest pain. Noninvasive evaluation has been nondiagnostic.   Procedural Details: The right wrist was prepped, draped, and anesthetized with 1% lidocaine. Using the modified Seldinger technique, a 5 French sheath was introduced into the right radial artery. 3 mg of verapamil was administered through the sheath, weight-based unfractionated heparin was administered intravenously. Standard Judkins catheters were used for selective coronary angiography and left ventriculography. Catheter exchanges were performed over an exchange length guidewire. There were no immediate procedural complications. A TR band was used for radial hemostasis at the completion of the procedure.  The patient was transferred to the post catheterization recovery area for further monitoring.  Procedural Findings: Hemodynamics: AO 137/77 mean 105 mm Hg LV 129/30 mm Hg  Coronary angiography: Coronary dominance: right  Left mainstem: Superior takeoff, normal.   Left anterior descending (LAD): Large tortuous vessel. Normal.  Left circumflex (LCx): Normal.  Right coronary artery (RCA): Anterior takeoff. Normal.  Left ventriculography: Left ventricular systolic function is normal, LVEF is estimated at 65%, there is no significant mitral regurgitation   Final Conclusions:   1. Normal coronary anatomy 2. Normal LV function. 3. Elevated LV EDP  Recommendations: medical management.  Theron Aristaeter [facility]MD,FACC 05/04/2013, 11:19 AM

## 2013-05-04 NOTE — Discharge Instructions (Signed)

## 2013-05-07 ENCOUNTER — Telehealth: Payer: Self-pay | Admitting: Cardiology

## 2013-05-07 MED ORDER — FUROSEMIDE 20 MG PO TABS: 20.0000 mg | ORAL_TABLET | Freq: Every day | ORAL | Status: AC

## 2013-05-07 NOTE — Telephone Encounter (Signed)
Informed Phyllis at Dr. Eliseo SquiresBeachum office that pt was cleared to have surgery. Faxed notes to their office.

## 2013-05-07 NOTE — Telephone Encounter (Signed)
Message copied by Burnice LoganATES, Chrles Selley M on Mon May 07, 2013  3:32 PM ------      Message from: [place] GroveBRANCH, JONATHAN F      Created: Mon May 07, 2013  2:52 PM       Can you please let patient know she is cleared for her surgery from a heart standpoint, and forward my clinic note from 04/23/13 to her surgeron. Please start her on lasix 20mg  daily as well, her test also showed she had some extra fluid in her body            Dina RichJonathan Branch MD ------

## 2013-05-07 NOTE — Telephone Encounter (Signed)
Pt informed. [place] [facility] [place]. Notes sent to Dr. Cristy FolksBeacham

## 2013-05-07 NOTE — Telephone Encounter (Signed)
Kim obtained [phone]#[id number]

## 2013-05-24 ENCOUNTER — Encounter: Payer: Self-pay | Admitting: Cardiology

## 2013-05-24 ENCOUNTER — Ambulatory Visit (INDEPENDENT_AMBULATORY_CARE_PROVIDER_SITE_OTHER): Payer: Medicare FFS | Admitting: Cardiology

## 2013-05-24 VITALS — BP 125/85 | HR 89 | Ht 65.0 in | Wt 241.0 lb

## 2013-05-24 DIAGNOSIS — Z01810 Encounter for preprocedural cardiovascular examination: Secondary | ICD-10-CM

## 2013-05-24 DIAGNOSIS — I38 Endocarditis, valve unspecified: Secondary | ICD-10-CM

## 2013-05-24 DIAGNOSIS — I10 Essential (primary) hypertension: Secondary | ICD-10-CM

## 2013-05-24 NOTE — Patient Instructions (Signed)
Continue all current medications. Your physician wants you to follow up in:  1 year.  You will receive a reminder letter in the mail one-two months in advance.  If you don't receive a letter, please call our office to schedule the follow up appointment   

## 2013-05-24 NOTE — Progress Notes (Signed)
Clinical Summary Ms. Ransom is a 68 y.o.female seen today for follow up of the following medical problems.   1. Preoperative cardiac evaluation  - history of abdominal pain related to umbilical hernia, being considered for surgery.  - no known history of heart trourbles.  does have some chest pain at times. Started approx 1 year. Aching/sharp pain left chest, 6/10. +nausea, left arm and hand numbness. + SOB + diaphoresis. Can occur at rest or with exertion. Pain lasts 5 minutes. Occurs approx 1-2 times a week. Notes increase in frequency, increasing in severity. Increased fatigue/DOE.   - echo 10/2012:technically difficult study, LVEF 60-65%, mild to mod MR, mild to mod TR, diastolic dysfunction not defined grade.  - fairly sedentary lifestyle due to chronic lung issues requiring 24 hr O2, highest level of exertion is walking around at home.   - given her symptoms and cardiac risk factors, she was referred for dobutamine nuclear stress. Test was suboptimal due to adverse reaction to dobutamine with hypotension. Referred for cath which showed patent coronaries.        Past Medical History  Diagnosis Date  . Unspecified essential hypertension   . GERD (gastroesophageal reflux disease)   . DJD (degenerative joint disease)   . Depression   . Anxiety   . Diverticulosis   . Obesity   . Hiatal hernia      Allergies  Allergen Reactions  . Amlodipine Swelling  . Calcium Channel Blockers Swelling  . Diltiazem Swelling     Current Outpatient Prescriptions  Medication Sig Dispense Refill  . ALPRAZolam (XANAX) 0.5 MG tablet Take 0.5 mg by mouth 2 (two) times daily as needed for anxiety.      . cycloSPORINE (RESTASIS) 0.05 % ophthalmic emulsion Place 1 drop into both eyes 2 (two) times daily.      . DULoxetine (CYMBALTA) 60 MG capsule Take 60 mg by mouth 2 (two) times daily.      . fluticasone (FLONASE) 50 MCG/ACT nasal spray Place 2 sprays into both nostrils daily.      .  furosemide (LASIX) 20 MG tablet Take 1 tablet (20 mg total) by mouth daily.  30 tablet  6  . omeprazole (PRILOSEC) 20 MG capsule Take 20 mg by mouth daily.      . QUEtiapine (SEROQUEL) 200 MG tablet Take 200 mg by mouth at bedtime.      . valsartan (DIOVAN) 160 MG tablet Take 160 mg by mouth daily.       No current facility-administered medications for this visit.     Past Surgical History  Procedure Laterality Date  . Appendectomy    . Hernia repair  1999  . Spinal fusion    . Knee surgery  2001  . Right shoulder surgery  12/08/2009  . Tubal ligation  1980'S  . Vaginal hysterectomy  1982  . Bunionectomy  06/2000    LEFT FOOT     Allergies  Allergen Reactions  . Amlodipine Swelling  . Calcium Channel Blockers Swelling  . Diltiazem Swelling      Family History  Problem Relation Age of Onset  . Diabetes    . Hypertension    . CAD    . Heart attack    . Breast cancer       Social History Ms. Wagman reports that she quit smoking about 31 years ago. Her smoking use included Cigarettes. She has a 2 pack-year smoking history. She does not have any smokeless tobacco history  on file. Ms. Overholt has no alcohol history on file.   Review of Systems CONSTITUTIONAL: No weight loss, fever, chills, weakness or fatigue.  HEENT: Eyes: No visual loss, blurred vision, double vision or yellow sclerae.No hearing loss, sneezing, congestion, runny nose or sore throat.  SKIN: No rash or itching.  CARDIOVASCULAR: per HPI RESPIRATORY: SOB GASTROINTESTINAL: No anorexia, nausea, vomiting or diarrhea. No abdominal pain or blood.  GENITOURINARY: No burning on urination, no polyuria NEUROLOGICAL: No headache, dizziness, syncope, paralysis, ataxia, numbness or tingling in the extremities. No change in bowel or bladder control.  MUSCULOSKELETAL: No muscle, back pain, joint pain or stiffness.  LYMPHATICS: No enlarged nodes. No history of splenectomy.  PSYCHIATRIC: No history of depression or  anxiety.  ENDOCRINOLOGIC: No reports of sweating, cold or heat intolerance. No polyuria or polydipsia.  Marland Kitchen   Physical Examination p 89 bp 125/85 Wt 241 lbs BMI 40 Gen: resting comfortably, no acute distress HEENT: no scleral icterus, pupils equal round and reactive, no palptable cervical adenopathy,  CV: RRR, 2/6 systolic murmur at apex, no JVD Resp: Clear to auscultation bilaterally GI: abdomen is soft, non-tender, non-distended, normal bowel sounds, no hepatosplenomegaly MSK: extremities are warm, no edema. Right radial site well healed with normal palpable pulse.  Skin: warm, no rash Neuro:  no focal deficits Psych: appropriate affect   Diagnostic Studies  Echo 10/2012:technically difficult study, LVEF 60-65%, mild to mod MR, mild to mod TR, diastolic dysfunction not defined grade.    05/04/13 Cath Procedural Findings:  Hemodynamics:  AO 137/77 mean 105 mm Hg  LV 129/30 mm Hg  Coronary angiography:  Coronary dominance: right  Left mainstem: Superior takeoff, normal.  Left anterior descending (LAD): Large tortuous vessel. Normal.  Left circumflex (LCx): Normal.  Right coronary artery (RCA): Anterior takeoff. Normal.  Left ventriculography: Left ventricular systolic function is normal, LVEF is estimated at 65%, there is no significant mitral regurgitation  Final Conclusions:  1. Normal coronary anatomy  2. Normal LV function.  3. Elevated LV EDP     Assessment and Plan  1. Chest pain  - no evidence of obstructive CAD by cath  2. Preoperative cardiac evaluation  - there are no contraindications to abdominal surgery from a cardiac standpoint.   3. Valvular heart disease  - mild to mod MR and TR by recent echo, will follow clinically.  - SOB improved since starting lasix.   4. HTN  - managed by PCP, bp at goal in clinic today    Follow up 1 year   Antoine Poche, M.D., F.A.C.C.

## 2013-06-13 ENCOUNTER — Telehealth (HOSPITAL_COMMUNITY): Payer: Self-pay | Admitting: Dietician

## 2013-06-13 NOTE — Telephone Encounter (Signed)
Received referral via fax from [facility]. Per referral, pt has a question regarding protein consumption for weight management. Called at [phone] and left message on pt voicemail.

## 2013-06-21 NOTE — Telephone Encounter (Signed)
No response to previous contact attempt. Sent letter to pt home via US Mail in attempt to contact pt to schedule appointment.  

## 2013-06-28 NOTE — Telephone Encounter (Signed)
Pt has not responded to attempts to contact to schedule appointment. Referral filed.  

## 2014-02-07 ENCOUNTER — Encounter (HOSPITAL_COMMUNITY): Payer: Self-pay | Admitting: Cardiology

## 2014-08-06 ENCOUNTER — Encounter: Payer: Self-pay | Admitting: Neurology

## 2014-08-06 ENCOUNTER — Telehealth: Payer: Self-pay | Admitting: Neurology

## 2014-08-06 NOTE — Telephone Encounter (Signed)
This patient did not show for an EMG appointment today. 

## 2014-08-07 ENCOUNTER — Encounter: Payer: Self-pay | Admitting: Neurology

## 2014-09-09 ENCOUNTER — Ambulatory Visit (INDEPENDENT_AMBULATORY_CARE_PROVIDER_SITE_OTHER): Payer: Self-pay | Admitting: Neurology

## 2014-09-09 ENCOUNTER — Ambulatory Visit (INDEPENDENT_AMBULATORY_CARE_PROVIDER_SITE_OTHER): Payer: Medicare HMO | Admitting: Neurology

## 2014-09-09 ENCOUNTER — Encounter: Payer: Self-pay | Admitting: Neurology

## 2014-09-09 DIAGNOSIS — M545 Low back pain, unspecified: Secondary | ICD-10-CM

## 2014-09-09 DIAGNOSIS — R29898 Other symptoms and signs involving the musculoskeletal system: Secondary | ICD-10-CM

## 2014-09-09 DIAGNOSIS — R079 Chest pain, unspecified: Secondary | ICD-10-CM

## 2014-09-09 DIAGNOSIS — M542 Cervicalgia: Secondary | ICD-10-CM | POA: Diagnosis not present

## 2014-09-09 NOTE — Procedures (Signed)
HISTORY:  [name] is a 69 year old patient with a history of a prior L5-S1 decompressive laminectomy in 2011. The patient has had some residual back pain and leg weakness. 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 The patient also has chronic neck discomfort, bilateral shoulder pain. She is being evaluated for the above symptoms. She has a history of borderline diabetes.  NERVE CONDUCTION STUDIES:  Nerve conduction studies were performed on both upper extremities. The distal motor latencies and motor amplitudes for the median and ulnar nerves were within normal limits. The F wave latencies and nerve conduction velocities for these nerves were also normal. The sensory latencies for the median and ulnar nerves were normal.  Nerve conduction studies were performed on both lower extremities. The distal motor latencies for the peroneal nerves are normal bilaterally, with a low motor amplitude on the right, borderline normal on the left. The nerve conduction velocities for these nerves are normal on the left, slowed below the knee only on the right. The distal motor latencies for the posterior tibial nerves were normal bilaterally with low motor amplitudes for these nerves bilaterally. The sensory latencies for the peroneal nerves are normal bilaterally, with absent H reflex latencies bilaterally.  EMG STUDIES:  EMG study was performed on the right upper extremity:  The first dorsal interosseous muscle reveals 2 to 4 K units with full recruitment. No fibrillations or positive waves were noted. The abductor pollicis brevis muscle reveals 2 to 4 K units with full recruitment. No fibrillations or positive waves were noted. The extensor indicis proprius muscle reveals 1 to 3 K units with full recruitment. No fibrillations or positive waves were noted. The pronator teres muscle reveals 2 to 3 K units with full recruitment. No fibrillations or  positive waves were noted. The biceps muscle reveals 1 to 2 K units with full recruitment. No fibrillations or positive waves were noted. The triceps muscle reveals 2 to 4 K units with full recruitment. No fibrillations or positive waves were noted. The anterior deltoid muscle reveals 2 to 3 K units with full recruitment. No fibrillations or positive waves were noted. The cervical paraspinal muscles were tested at 2 levels. No abnormalities of insertional activity were seen at either level tested. There was good relaxation.  EMG study was performed on the right lower extremity:  The tibialis anterior muscle reveals 2 to 4K motor units with full recruitment. No fibrillations or positive waves were seen. The peroneus tertius muscle reveals 2 to 5K motor units with decreased recruitment. No fibrillations or positive waves were seen. The medial gastrocnemius muscle reveals 1 to 3K motor units with full recruitment. No fibrillations or positive waves were seen. The vastus lateralis muscle reveals 2 to 4K motor units with decreased recruitment. No fibrillations or positive waves were seen. The iliopsoas muscle reveals 2 to 5K motor units with full recruitment. No fibrillations or positive waves were seen. The biceps femoris muscle (long head) reveals 2 to 4K motor units with full recruitment. No fibrillations or positive waves were seen. The lumbosacral paraspinal muscles were tested at 3 levels, and revealed no abnormalities of insertional activity at all 3 levels tested. There was good relaxation.   IMPRESSION:  Nerve conduction studies done on all 4 extremities shows diffuse lowering of motor amplitudes with sensory sparing in the lower extremities. 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 There is no clear evidence of a sensorimotor peripheral neuropathy. EMG evaluation of the right upper extremity is unremarkable, without  evidence of an overlying cervical radiculopathy. EMG of the right lower extremity shows some chronic stable signs of denervation in some of the L5 innervated muscles. There is no evidence of an overlying steroid myopathy.  Marlan Palau MD 09/09/2014 11:42 AM  [facility] [address] [place], [place] [address]  Phone [phone] Fax [phone]

## 2014-09-09 NOTE — Progress Notes (Signed)
Please refer to EMG and nerve conduction study procedure note. 

## 2015-02-06 ENCOUNTER — Other Ambulatory Visit (HOSPITAL_COMMUNITY): Payer: Medicare FFS

## 2015-02-11 ENCOUNTER — Ambulatory Visit (HOSPITAL_COMMUNITY): Admission: RE | Admit: 2015-02-11 | Payer: Medicare FFS | Source: Ambulatory Visit | Admitting: Ophthalmology

## 2015-02-11 ENCOUNTER — Encounter (HOSPITAL_COMMUNITY): Admission: RE | Payer: Self-pay | Source: Ambulatory Visit

## 2015-02-11 SURGERY — PHACOEMULSIFICATION, CATARACT, WITH IOL INSERTION
Anesthesia: Monitor Anesthesia Care | Laterality: Right

## 2015-03-06 ENCOUNTER — Other Ambulatory Visit (HOSPITAL_COMMUNITY): Payer: Medicare FFS

## 2015-03-11 ENCOUNTER — Ambulatory Visit: Admit: 2015-03-11 | Payer: Medicare FFS | Admitting: Ophthalmology

## 2015-03-11 SURGERY — PHACOEMULSIFICATION, CATARACT, WITH IOL INSERTION
Anesthesia: Monitor Anesthesia Care | Laterality: Left

## 2015-09-10 IMAGING — NM NM MYOCAR SINGLE W/SPECT W/WALL MOTION & EF
1 series · 6 of 6 positions shown · non-contrast
Comparison: none

[cr cardiac tc low dose · 6.41mm/px · 6 of 64 frames shown]
[frame 6/64]
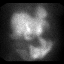
[frame 16/64]
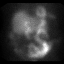
[frame 27/64]
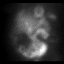
[frame 38/64]
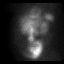
[frame 48/64]
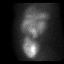
[frame 59/64]
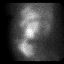

[6 of 6 positions shown; findings below may reference images not displayed]

Canned report from images found in remote index.

Refer to host system for actual result text.

## 2019-09-12 ENCOUNTER — Ambulatory Visit: Payer: Self-pay | Admitting: Urology

## 2022-07-13 ENCOUNTER — Other Ambulatory Visit (HOSPITAL_BASED_OUTPATIENT_CLINIC_OR_DEPARTMENT_OTHER): Payer: Self-pay

## 2022-07-13 DIAGNOSIS — R0681 Apnea, not elsewhere classified: Secondary | ICD-10-CM

## 2022-07-13 DIAGNOSIS — R0683 Snoring: Secondary | ICD-10-CM

## 2022-10-22 ENCOUNTER — Encounter: Payer: Self-pay | Admitting: Pulmonary Disease

## 2022-10-27 ENCOUNTER — Encounter: Payer: Self-pay | Admitting: Pulmonary Disease

## 2023-03-02 DEATH — deceased
